# Patient Record
Sex: Male | Born: 1990 | Hispanic: No | Marital: Single | Smoking: Current every day smoker
Health system: Southern US, Community
[De-identification: ages and names within clinical notes are randomized; demographics above are authoritative.]

---
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
## 2010-06-03 LAB — LIPASE, BLOOD: Lipase: 26 U/L (ref 11–59)

## 2010-06-03 LAB — BASIC METABOLIC PANEL
BUN: 8 mg/dL (ref 6–23)
CO2: 26 mEq/L (ref 19–32)
Calcium: 9.7 mg/dL (ref 8.4–10.5)
Chloride: 103 mEq/L (ref 96–112)
Creatinine, Ser: 0.88 mg/dL (ref 0.4–1.5)
GFR calc Af Amer: >60 mL/min (ref >60)
GFR calc non Af Amer: >60 mL/min (ref >60)
Glucose, Bld: 86 mg/dL (ref 70–99)
Potassium: 3.6 mEq/L (ref 3.5–5.1)
Sodium: 139 mEq/L (ref 135–145)

## 2010-06-03 LAB — DIFFERENTIAL
Basophils Absolute: 0 10*3/uL (ref 0.0–0.1)
Basophils Relative: 0 % (ref 0–1)
Lymphocytes Relative: 12 % (ref 12–46)
Monocytes Absolute: 1.1 10*3/uL — ABNORMAL HIGH (ref 0.1–1.0)
Neutro Abs: 12.5 10*3/uL — ABNORMAL HIGH (ref 1.7–7.7)
Neutrophils Relative %: 81 % — ABNORMAL HIGH (ref 43–77)

## 2010-06-03 LAB — URINALYSIS, ROUTINE W REFLEX MICROSCOPIC
Bilirubin Urine: NEGATIVE
Glucose, UA: NEGATIVE mg/dL
Hgb urine dipstick: NEGATIVE
Ketones, ur: 40 mg/dL — AB
Nitrite: NEGATIVE
Protein, ur: NEGATIVE mg/dL
Specific Gravity, Urine: 1.022 (ref 1.005–1.030)
Urobilinogen, UA: 1 mg/dL (ref 0.0–1.0)
pH: 7 (ref 5.0–8.0)

## 2010-06-03 LAB — CBC
HCT: 49.9 % (ref 39.0–52.0)
Hemoglobin: 16.9 g/dL (ref 13.0–17.0)
MCHC: 33.9 g/dL (ref 30.0–36.0)
MCV: 91.6 fL (ref 78.0–100.0)
Platelets: 178 10*3/uL (ref 150–400)
RBC: 5.44 MIL/uL (ref 4.22–5.81)
RDW: 12.7 % (ref 11.5–15.5)
WBC: 15.4 10*3/uL — ABNORMAL HIGH (ref 4.0–10.5)

## 2017-11-09 ENCOUNTER — Emergency Department (HOSPITAL_COMMUNITY): Payer: No Typology Code available for payment source

## 2017-11-09 ENCOUNTER — Encounter (HOSPITAL_COMMUNITY): Payer: Self-pay | Admitting: Emergency Medicine

## 2017-11-09 ENCOUNTER — Other Ambulatory Visit: Payer: Self-pay

## 2017-11-09 ENCOUNTER — Emergency Department (HOSPITAL_COMMUNITY)
Admission: EM | Admit: 2017-11-09 | Discharge: 2017-11-09 | Disposition: A | Discharge status: Home / Self Care | Payer: No Typology Code available for payment source | Attending: Emergency Medicine | Admitting: Emergency Medicine

## 2017-11-09 DIAGNOSIS — M546 Pain in thoracic spine: Secondary | ICD-10-CM | POA: Diagnosis not present

## 2017-11-09 DIAGNOSIS — R079 Chest pain, unspecified: Secondary | ICD-10-CM | POA: Diagnosis not present

## 2017-11-09 DIAGNOSIS — M25532 Pain in left wrist: Secondary | ICD-10-CM | POA: Diagnosis not present

## 2017-11-09 DIAGNOSIS — R410 Disorientation, unspecified: Secondary | ICD-10-CM | POA: Insufficient documentation

## 2017-11-09 DIAGNOSIS — F17210 Nicotine dependence, cigarettes, uncomplicated: Secondary | ICD-10-CM | POA: Diagnosis not present

## 2017-11-09 DIAGNOSIS — M79661 Pain in right lower leg: Secondary | ICD-10-CM | POA: Diagnosis not present

## 2017-11-09 DIAGNOSIS — R1084 Generalized abdominal pain: Secondary | ICD-10-CM | POA: Diagnosis not present

## 2017-11-09 LAB — COMPREHENSIVE METABOLIC PANEL
ALT: 51 U/L — ABNORMAL HIGH (ref 0–44)
ANION GAP: 14 (ref 5–15)
AST: 45 U/L — ABNORMAL HIGH (ref 15–41)
Albumin: 4.1 g/dL (ref 3.5–5.0)
Alkaline Phosphatase: 83 U/L (ref 38–126)
BUN: 6 mg/dL (ref 6–20)
CHLORIDE: 107 mmol/L (ref 98–111)
CO2: 19 mmol/L — ABNORMAL LOW (ref 22–32)
Calcium: 9 mg/dL (ref 8.9–10.3)
Creatinine, Ser: 0.85 mg/dL (ref 0.61–1.24)
Glucose, Bld: 98 mg/dL (ref 70–99)
POTASSIUM: 3.5 mmol/L (ref 3.5–5.1)
Sodium: 140 mmol/L (ref 135–145)
Total Bilirubin: 0.5 mg/dL (ref 0.3–1.2)
Total Protein: 7.5 g/dL (ref 6.5–8.1)

## 2017-11-09 LAB — URINALYSIS, ROUTINE W REFLEX MICROSCOPIC
BILIRUBIN URINE: NEGATIVE
Bacteria, UA: NONE SEEN
Glucose, UA: NEGATIVE mg/dL
Ketones, ur: 5 mg/dL — AB
Leukocytes, UA: NEGATIVE
NITRITE: NEGATIVE
PH: 5 (ref 5.0–8.0)
Protein, ur: NEGATIVE mg/dL

## 2017-11-09 LAB — I-STAT CHEM 8, ED
BUN: 5 mg/dL — ABNORMAL LOW (ref 6–20)
CREATININE: 1 mg/dL (ref 0.61–1.24)
Calcium, Ion: 1.1 mmol/L — ABNORMAL LOW (ref 1.15–1.40)
Chloride: 107 mmol/L (ref 98–111)
GLUCOSE: 97 mg/dL (ref 70–99)
HCT: 46 % (ref 39.0–52.0)
HEMOGLOBIN: 15.6 g/dL (ref 13.0–17.0)
Potassium: 3.4 mmol/L — ABNORMAL LOW (ref 3.5–5.1)
Sodium: 140 mmol/L (ref 135–145)
TCO2: 19 mmol/L — AB (ref 22–32)

## 2017-11-09 LAB — CBC
HEMATOCRIT: 45.6 % (ref 39.0–52.0)
Hemoglobin: 15.3 g/dL (ref 13.0–17.0)
MCH: 29.9 pg (ref 26.0–34.0)
MCHC: 33.6 g/dL (ref 30.0–36.0)
MCV: 89.1 fL (ref 78.0–100.0)
PLATELETS: 269 10*3/uL (ref 150–400)
RBC: 5.12 MIL/uL (ref 4.22–5.81)
RDW: 12.7 % (ref 11.5–15.5)
WBC: 14.3 10*3/uL — AB (ref 4.0–10.5)

## 2017-11-09 LAB — PROTIME-INR
INR: 1.02
Prothrombin Time: 13.3 seconds (ref 11.4–15.2)

## 2017-11-09 LAB — SAMPLE TO BLOOD BANK

## 2017-11-09 LAB — I-STAT CG4 LACTIC ACID, ED: Lactic Acid, Venous: 3.42 mmol/L (ref 0.5–1.9)

## 2017-11-09 LAB — ETHANOL: Alcohol, Ethyl (B): 144 mg/dL — ABNORMAL HIGH (ref <10)

## 2017-11-09 LAB — CDS SEROLOGY

## 2017-11-09 MED ORDER — FENTANYL CITRATE (PF) 100 MCG/2ML IJ SOLN
50.0000 ug | Freq: Once | INTRAMUSCULAR | Status: AC
  Administered 2017-11-09: 50 ug via INTRAVENOUS
  Filled 2017-11-09: qty 2

## 2017-11-09 MED ORDER — TETANUS-DIPHTH-ACELL PERTUSSIS 5-2.5-18.5 LF-MCG/0.5 IM SUSP
0.5000 mL | Freq: Once | INTRAMUSCULAR | Status: AC
  Administered 2017-11-09: 0.5 mL via INTRAMUSCULAR
  Filled 2017-11-09: qty 0.5

## 2017-11-09 MED ORDER — SODIUM CHLORIDE 0.9 % IV BOLUS
1000.0000 mL | Freq: Once | INTRAVENOUS | Status: AC
  Administered 2017-11-09: 1000 mL via INTRAVENOUS

## 2017-11-09 MED ORDER — IOPAMIDOL (ISOVUE-300) INJECTION 61%
INTRAVENOUS | Status: AC
  Administered 2017-11-09: 100 mL
  Filled 2017-11-09: qty 100
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
## 2017-11-09 NOTE — Discharge Instructions (Signed)
Take 4 over the counter ibuprofen tablets 3 times a day or 2 over-the-counter naproxen tablets twice a day for pain. Also take tylenol 1000mg (2 extra strength) four times a day.    You will hurt worse tomorrow.  This is normal. If you have worsening shortness of breath please return to the ED.  Otherwise see you pcp in the office.

## 2017-11-09 NOTE — ED Notes (Signed)
Patient transported to CT 
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
## 2020-02-27 IMAGING — CT CT HEAD W/O CM
4 of 7 series · 16 of 47 positions shown, 19 images · non-contrast
Comparison: None.

CLINICAL DATA: Cervical spine trauma and head trauma, high clinical
risk

EXAM:
CT HEAD WITHOUT CONTRAST
CT CERVICAL SPINE WITHOUT CONTRAST
TECHNIQUE: Multidetector CT imaging of the head and cervical spine was
performed following the standard protocol without intravenous
contrast. Multiplanar CT image reconstructions of the cervical spine
were also generated.

[Series 4: head 5.0 h30s · axial · 0.48mm/px · z∈[-93,-13]mm · 3 of 33 slices shown]
[im 9/33  brain]
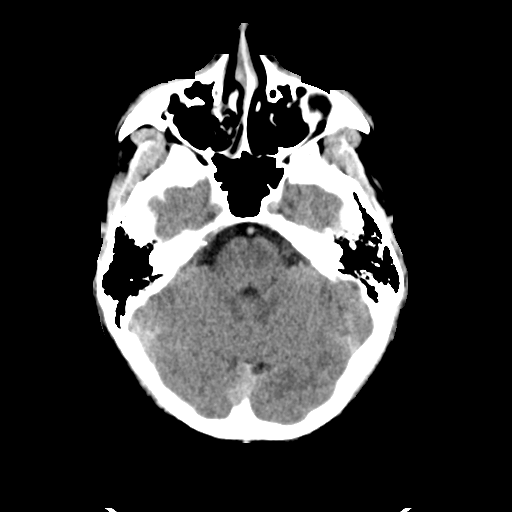
[im 17/33  brain]
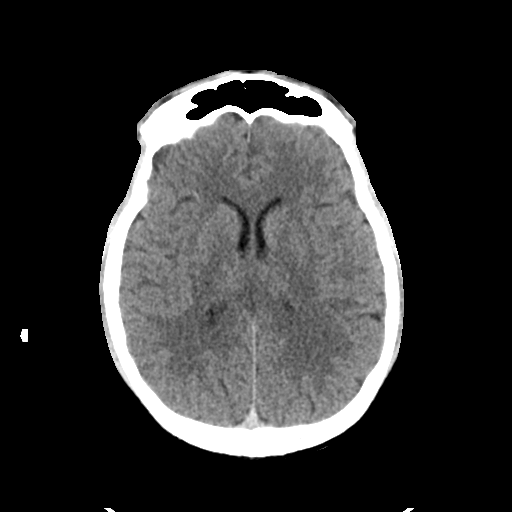
[im 25/33  brain]
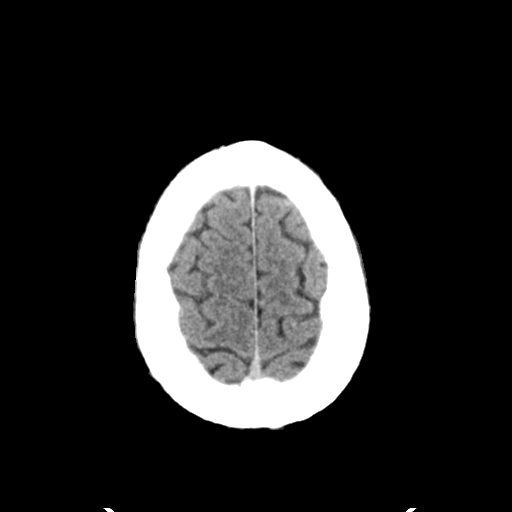

[Series 6: head 3.0 mpr cor · coronal · 0.34mm/px · 2 of 72 slices shown]
[im 24/72  brain]
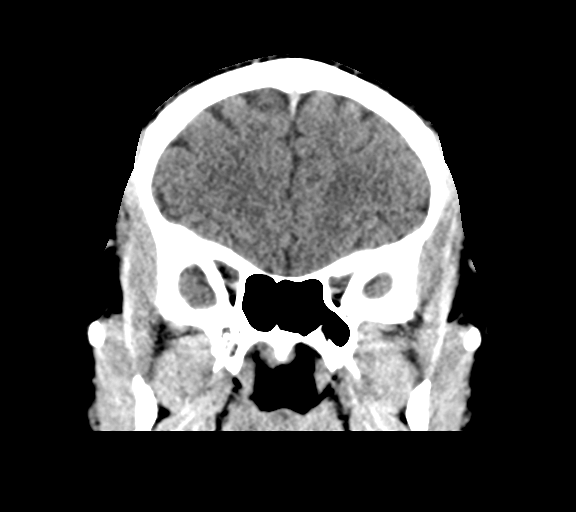
[im 48/72  brain]
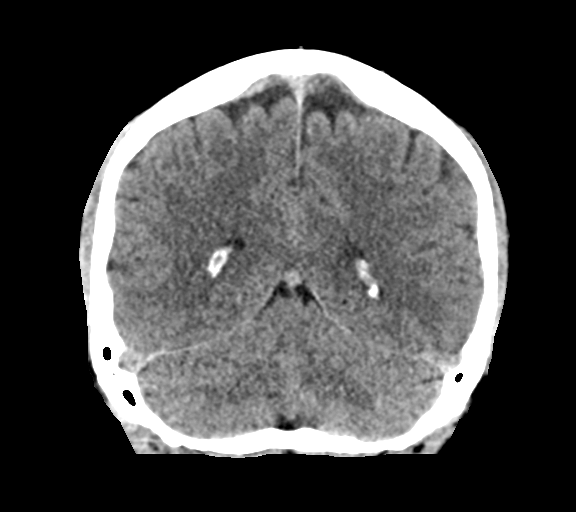

[Series 7: head 3.0 mpr sag · sagittal · 0.36mm/px · 1 of 66 slices shown]
[im 33/66  brain]
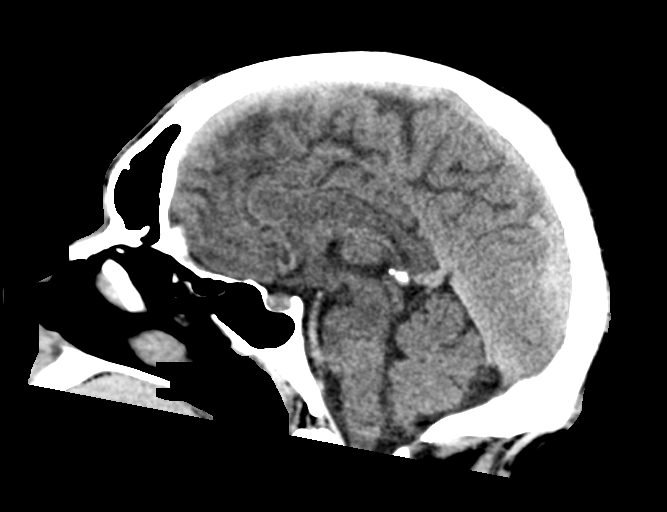

[Series 13: orthogonal axial st · axial · 0.38mm/px · z∈[-321,-164]mm · 10 of 90 slices shown, 13 images]
[im 9/90  brain]
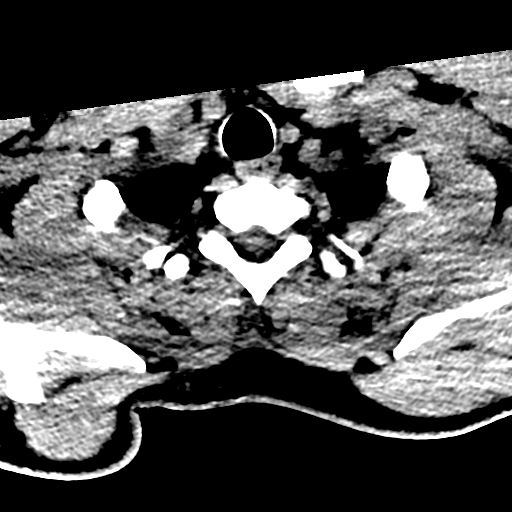
[im 9/90  bone]
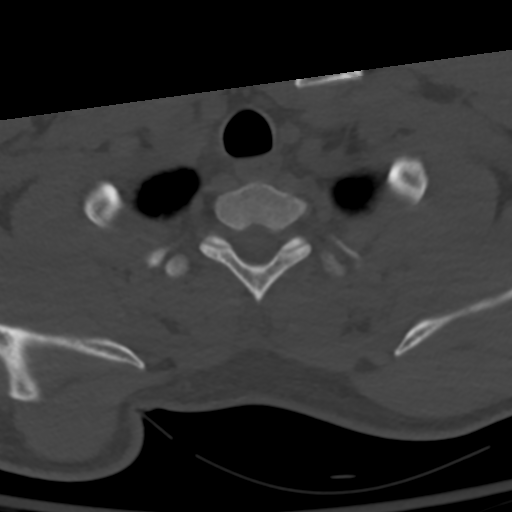
[im 17/90  brain]
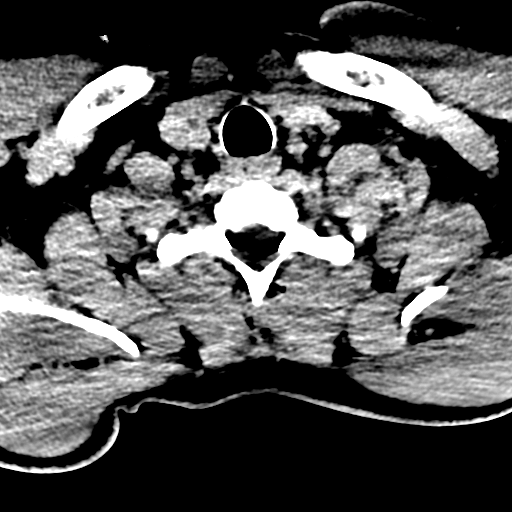
[im 25/90  brain]
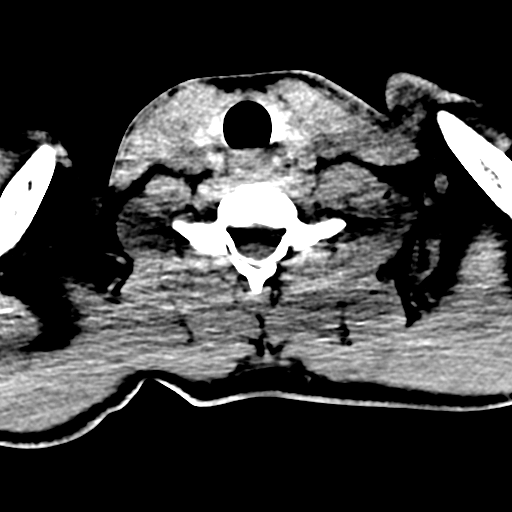
[im 33/90  brain]
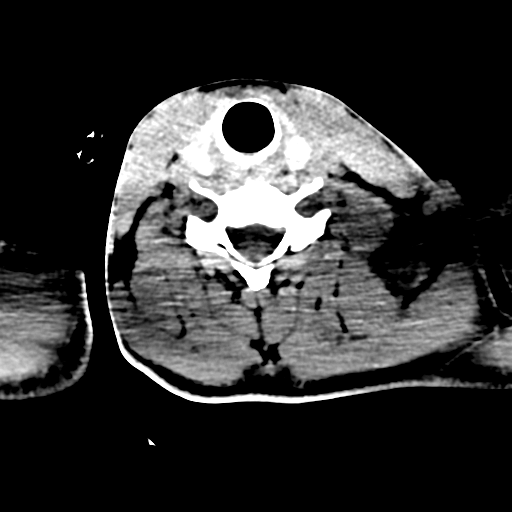
[im 41/90  brain]
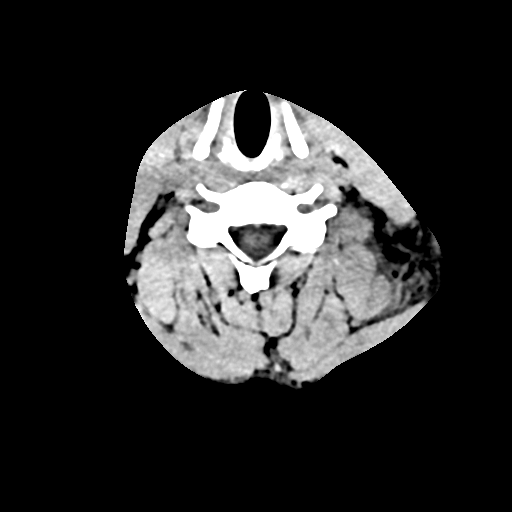
[im 41/90  bone]
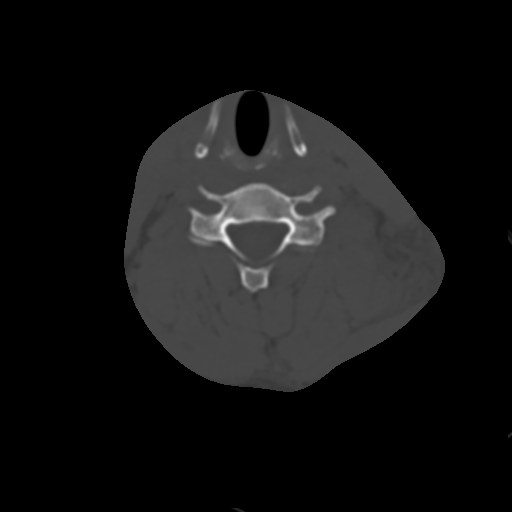
[im 49/90  brain]
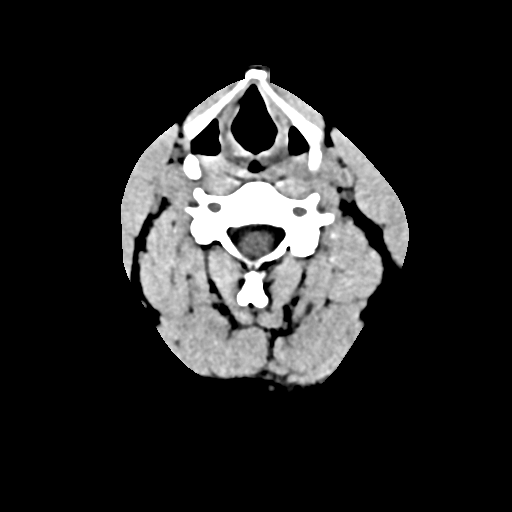
[im 57/90  brain]
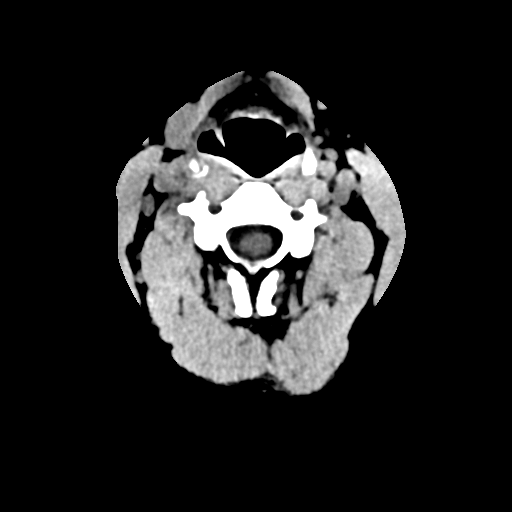
[im 65/90  brain]
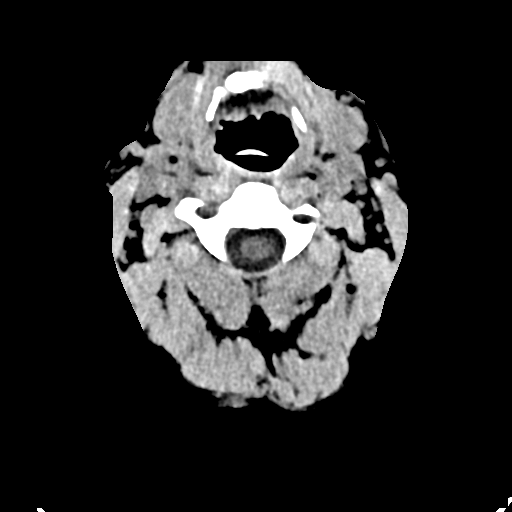
[im 73/90  brain]
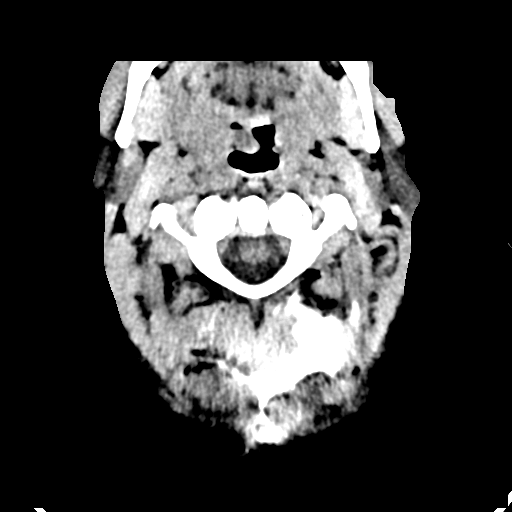
[im 73/90  bone]
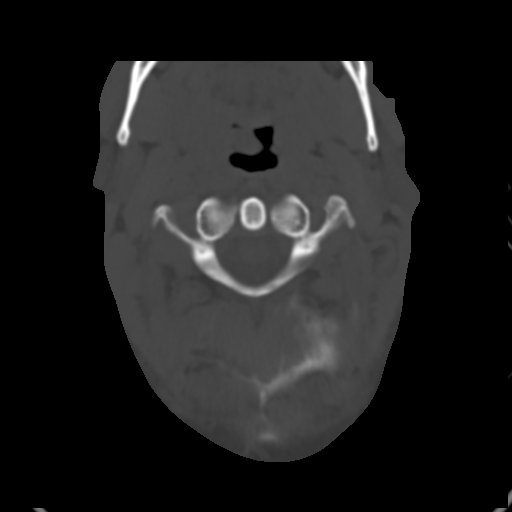
[im 81/90  brain]
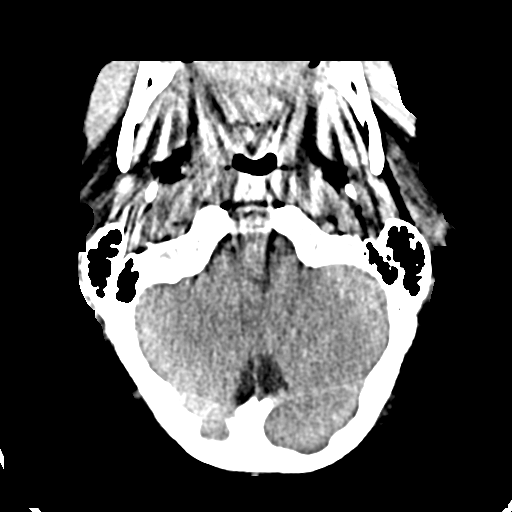

[16 of 47 positions shown; findings below may reference images not displayed]

FINDINGS: CT HEAD FINDINGS

Brain: No evidence of acute infarction, hemorrhage, hydrocephalus,
extra-axial collection or mass lesion/mass effect.

Vascular: No hyperdense vessel or unexpected calcification.

Skull: Normal. Negative for fracture or focal lesion.

Sinuses/Orbits: No evidence of injury

CT CERVICAL SPINE FINDINGS

Alignment: Normal

Skull base and vertebrae: Negative for fracture

Soft tissues and spinal canal: No prevertebral fluid or swelling. No
visible canal hematoma. There is stranding and small hematomas in
the left posterior triangle subcutaneous fat. The overlying skin is
also thickened.

Disc levels:  No evidence of herniation or impingement

Upper chest: Negative
IMPRESSION: 1. No evidence of acute intracranial or cervical spine injury.
2. Contusion in the left posterior triangle.
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
